# Patient Record
Sex: Male | Born: 1950 | Race: Asian | Hispanic: No | Marital: Married | Smoking: Former smoker
Health system: Southern US, Community
[De-identification: ages and names within clinical notes are randomized; demographics above are authoritative.]

## PROBLEM LIST (undated history)

## (undated) HISTORY — PX: EYE SURGERY: SHX253

---

## 2019-08-19 ENCOUNTER — Other Ambulatory Visit: Payer: Self-pay | Admitting: Otolaryngology

## 2019-08-19 DIAGNOSIS — H9191 Unspecified hearing loss, right ear: Secondary | ICD-10-CM

## 2019-08-19 DIAGNOSIS — H9311 Tinnitus, right ear: Secondary | ICD-10-CM

## 2019-09-09 ENCOUNTER — Ambulatory Visit
Admission: RE | Admit: 2019-09-09 | Discharge: 2019-09-09 | Disposition: A | Discharge status: Home / Self Care | Payer: Medicare HMO | Source: Ambulatory Visit | Attending: Otolaryngology | Admitting: Otolaryngology

## 2019-09-09 DIAGNOSIS — H9191 Unspecified hearing loss, right ear: Secondary | ICD-10-CM

## 2019-09-09 DIAGNOSIS — H9311 Tinnitus, right ear: Secondary | ICD-10-CM

## 2019-09-09 MED ORDER — GADOBENATE DIMEGLUMINE 529 MG/ML IV SOLN
13.0000 mL | Freq: Once | INTRAVENOUS | Status: AC | PRN
  Administered 2019-09-09: 14:00:00 13 mL via INTRAVENOUS

## 2020-01-29 ENCOUNTER — Ambulatory Visit: Payer: Medicare HMO | Attending: Internal Medicine

## 2020-01-29 DIAGNOSIS — Z23 Encounter for immunization: Secondary | ICD-10-CM

## 2020-01-29 NOTE — Progress Notes (Signed)
   Covid-19 Vaccination Clinic  Name:  Jamieon Lannen    MRN: [id number] DOB: 04/13/1951  01/29/2020  Mr. Pollack was observed post Covid-19 immunization for 15 minutes without incidence. He was provided with Vaccine Information Sheet and instruction to access the V-Safe system.   Mr. Bensinger was instructed to call 911 with any severe reactions post vaccine: Marland Kitchen Difficulty breathing  . Swelling of your face and throat  . A fast heartbeat  . A bad rash all over your body  . Dizziness and weakness    Immunizations Administered    Name Date Dose VIS Date Route   Pfizer COVID-19 Vaccine 01/29/2020  2:38 PM 0.3 mL 11/13/2019 Intramuscular   Manufacturer: [employer], [employer]   Lot: [id number]   NDC: [address]-3

## 2020-02-24 ENCOUNTER — Ambulatory Visit: Payer: Medicare HMO | Attending: Internal Medicine

## 2020-02-24 DIAGNOSIS — Z23 Encounter for immunization: Secondary | ICD-10-CM

## 2020-02-24 NOTE — Progress Notes (Signed)
   Covid-19 Vaccination Clinic  Name:  Hunter Atkinson    MRN: [id number] DOB: 1951-08-29  02/24/2020  Hunter Atkinson was observed post Covid-19 immunization for 15 minutes without incident. He was provided with Vaccine Information Sheet and instruction to access the V-Safe system.   Hunter Atkinson was instructed to call 911 with any severe reactions post vaccine: Marland Kitchen Difficulty breathing  . Swelling of face and throat  . A fast heartbeat  . A bad rash all over body  . Dizziness and weakness   Immunizations Administered    Name Date Dose VIS Date Route   Pfizer COVID-19 Vaccine 02/24/2020  2:57 PM 0.3 mL 11/13/2019 Intramuscular   Manufacturer: [employer], [employer]   Lot: [id number]   NDC: [address]-6

## 2020-10-12 IMAGING — MR MR BRAIN/IAC WO/W CM
11 of 12 series · 38 of 48 positions shown · IV contrast (multihance)
Comparison: None.

CLINICAL DATA: Right-sided hearing loss and tinnitus

EXAM:
MRI HEAD WITHOUT AND WITH CONTRAST
TECHNIQUE: Multiplanar, multiecho pulse sequences of the brain and surrounding
structures were obtained without and with intravenous contrast.
CONTRAST:  13mL MULTIHANCE GADOBENATE DIMEGLUMINE 529 MG/ML IV SOLN

[Series 2: T1 · sagittal · 5.0mm · 0.45mm/px · 4 of 23 slices shown (1 of 3)]
[im 1/23]
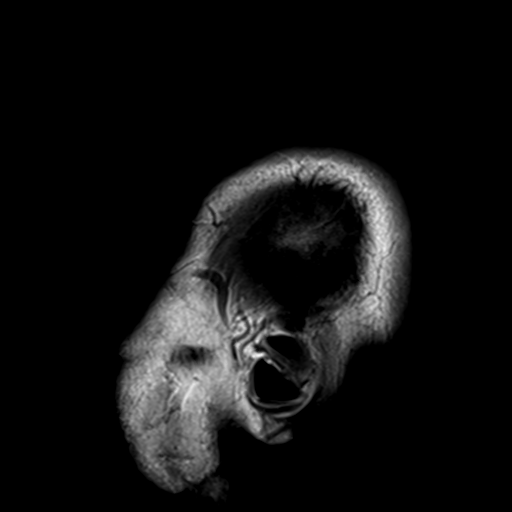
[im 8/23]
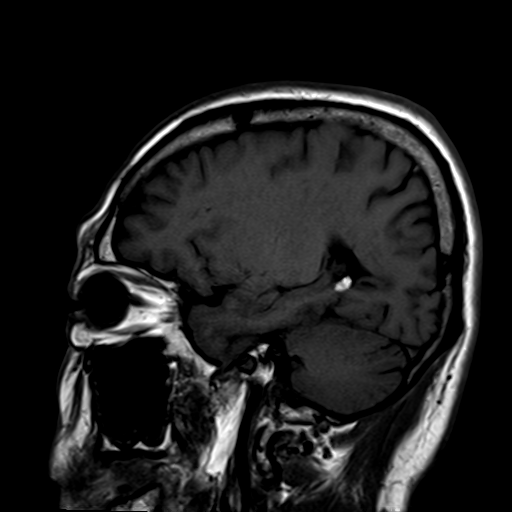
[im 15/23]
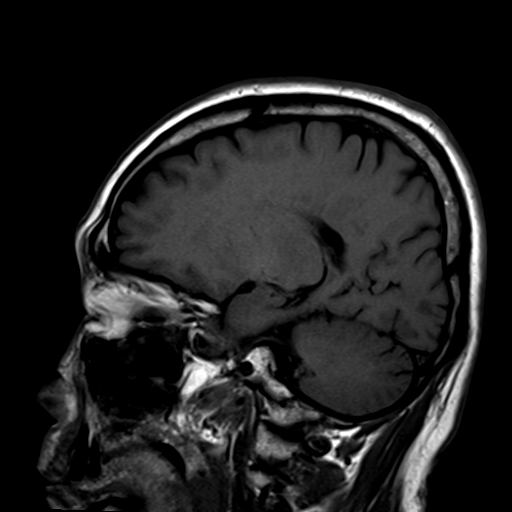
[im 23/23]
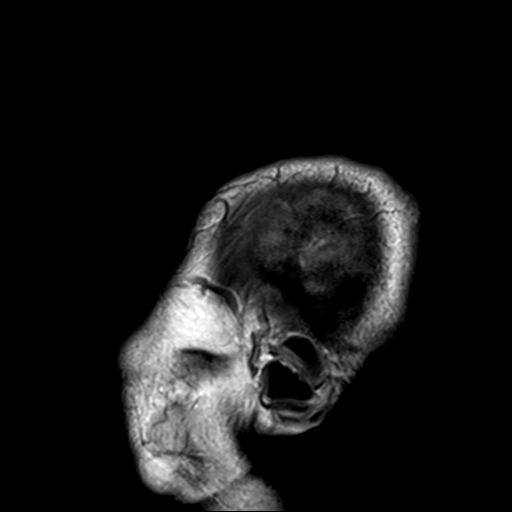

[Series 3: T2 · axial · 5.0mm · 0.45mm/px · z∈[-62,+80]mm · 4 of 23 slices shown]
[im 1/23]
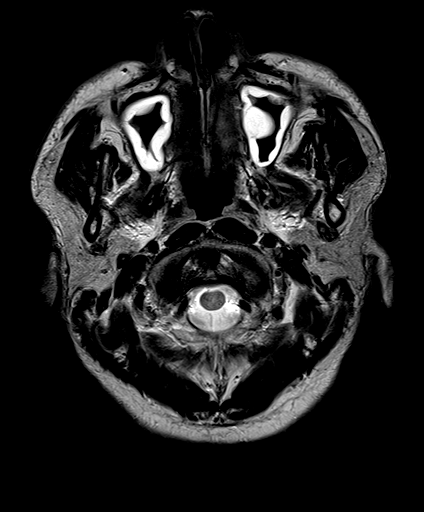
[im 8/23]
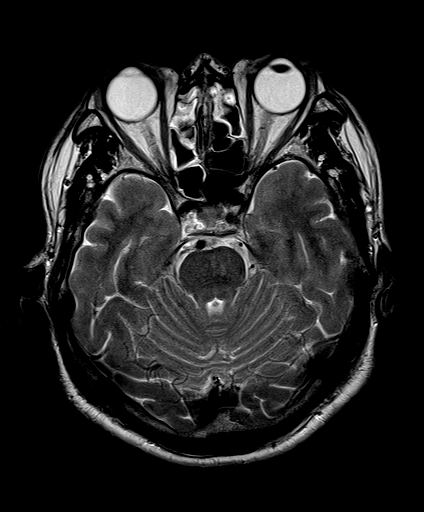
[im 15/23]
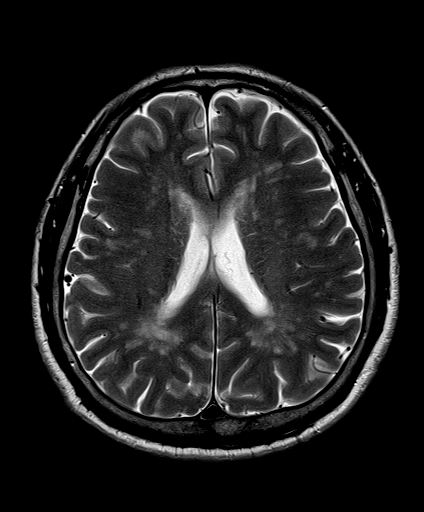
[im 23/23]
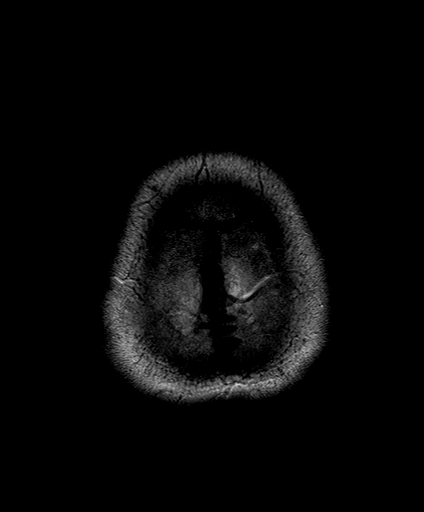

[Series 4: DWI · axial · 3.0mm · 1.80mm/px · z∈[-61,+79]mm · 11 of 96 slices shown (1 of 2)]
[im 1/96]
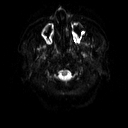
[im 10/96]
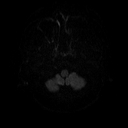
[im 20/96]
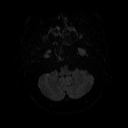
[im 29/96]
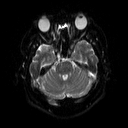
[im 39/96]
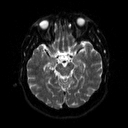
[im 48/96]
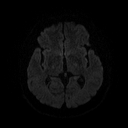
[im 58/96]
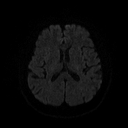
[im 67/96]
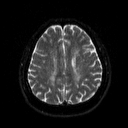
[im 77/96]
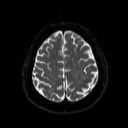
[im 86/96]
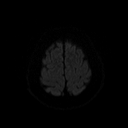
[im 96/96]
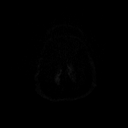

[Series 5: DWI · axial · 3.0mm · 1.80mm/px · z∈[-61,+79]mm · 5 of 47 slices shown (2 of 2)]
[im 1/47]
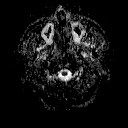
[im 12/47]
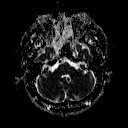
[im 24/47]
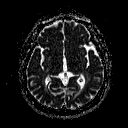
[im 35/47]
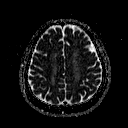
[im 47/47]
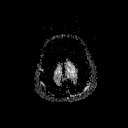

[Series 6: FLAIR · axial · 3.0mm · 0.45mm/px · z∈[-62,+80]mm · 3 of 30 slices shown]
[im 1/30]
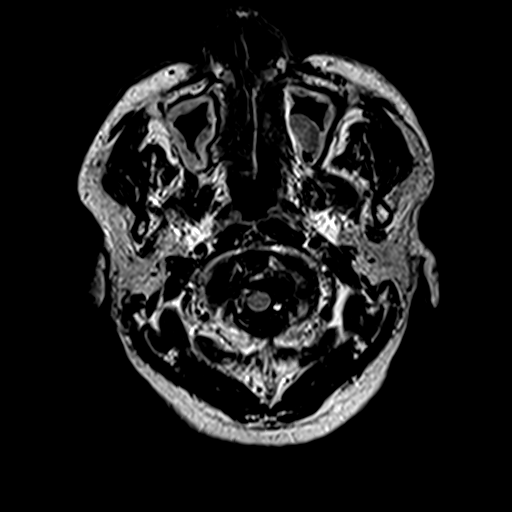
[im 15/30]
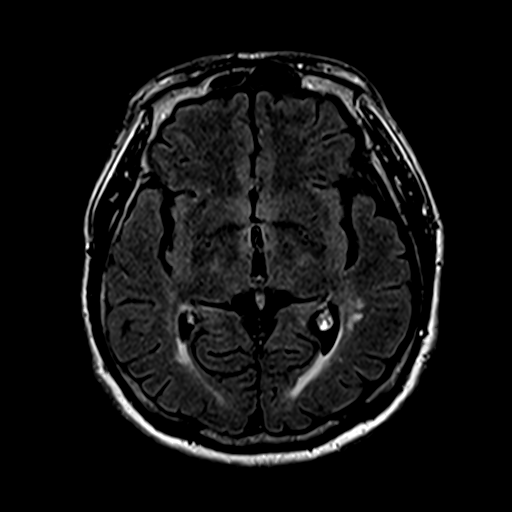
[im 30/30]
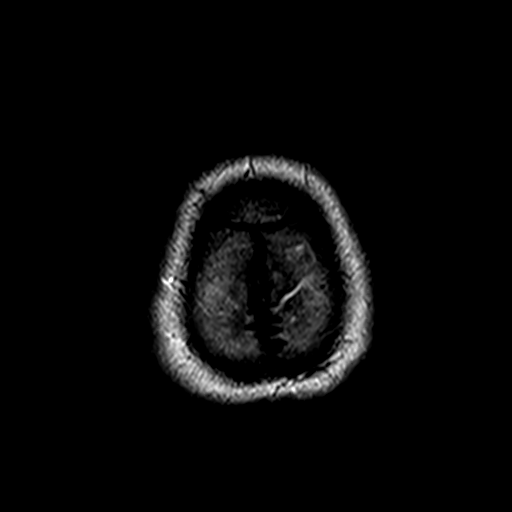

[Series 7: T1 · coronal · 3.0mm · 0.35mm/px · 1 of 11 slices shown (2 of 3)]
[im 1/11]
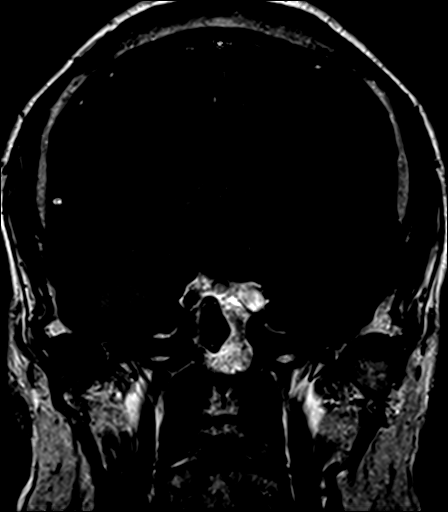

[Series 9: swi_images · axial · 3.0mm · 0.90mm/px · z∈[-61,+79]mm · 5 of 48 slices shown]
[im 1/48]
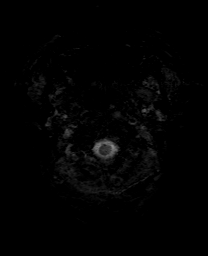
[im 12/48]
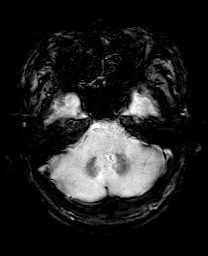
[im 24/48]
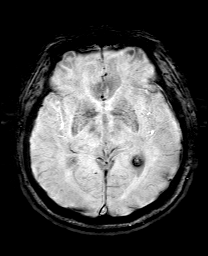
[im 36/48]
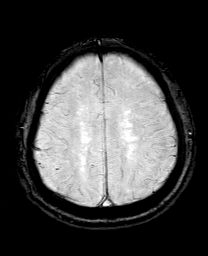
[im 48/48]
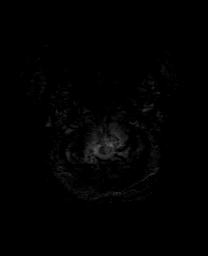

[Series 10: T1 · axial · 3.0mm · 0.35mm/px · 1 of 11 slices shown (3 of 3)]
[im 1/11]
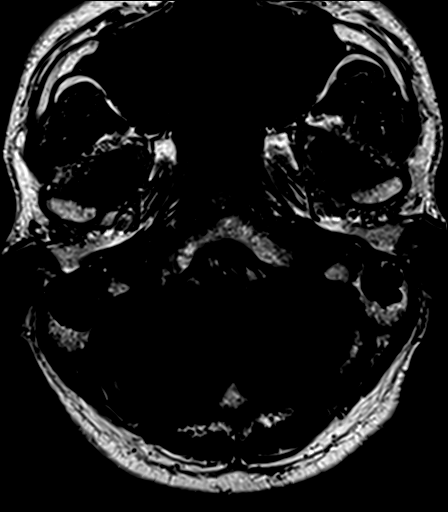

[Series 11: bSSFP · axial · 1.0mm · 0.28mm/px · z∈[-50,-39]mm · 2 of 36 slices shown]
[im 1/36]
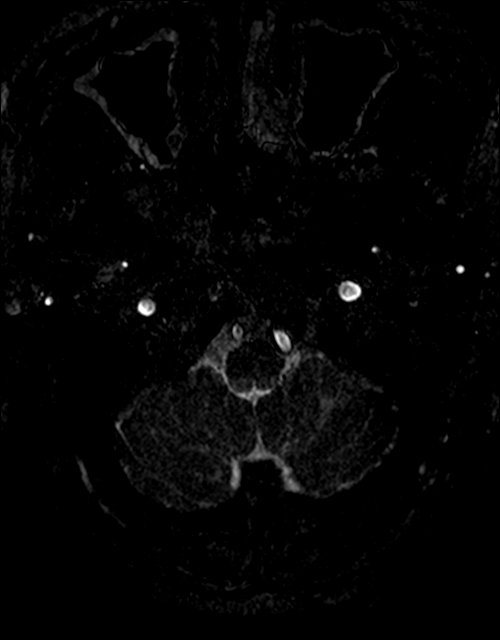
[im 12/36]
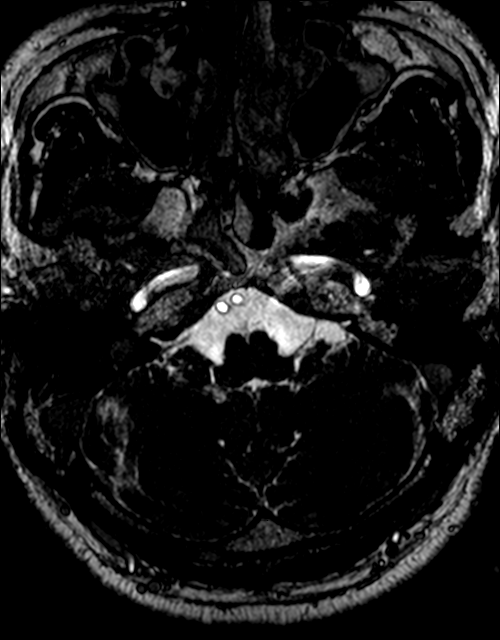

[Series 12: T1 post-contrast · coronal · 3.0mm · 0.35mm/px · 1 of 11 slices shown (1 of 2)]
[im 1/11]
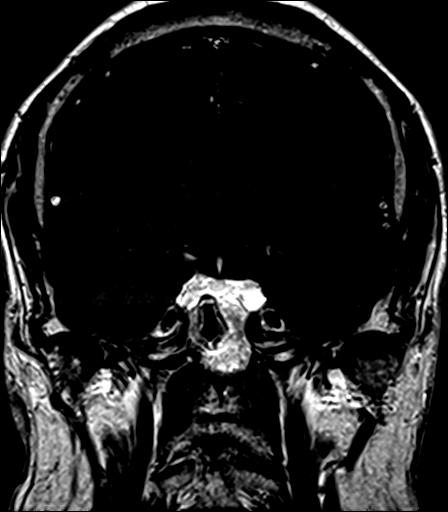

[Series 13: T1 post-contrast · axial · 3.0mm · 0.35mm/px · 1 of 11 slices shown (2 of 2)]
[im 1/11]
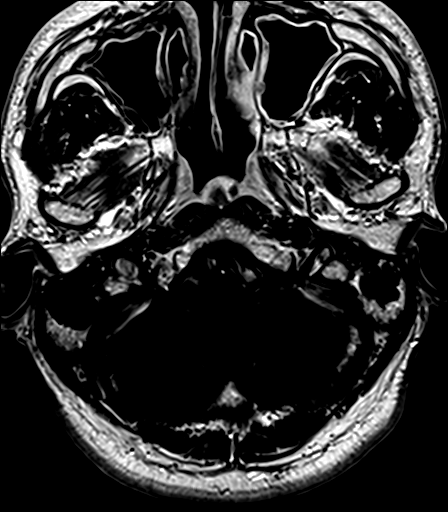

[38 of 48 positions shown; findings below may reference images not displayed]

FINDINGS: BRAIN: There is no acute infarct, acute hemorrhage or extra-axial
collection. Early confluent hyperintense T2-weighted signal of the
periventricular and deep white matter, most commonly due to chronic
ischemic microangiopathy. The cerebral and cerebellar volume are
age-appropriate. There is no hydrocephalus. The midline structures
are normal.

INTERNAL AUDITORY CANALS: There is no cerebellopontine angle mass.
The cochleae and semicircular canals are normal. No focal
abnormality along the course of the 7th and 8th cranial nerves.
Normal porus acusticus and vestibular aqueduct bilaterally.

VASCULAR: The major intracranial arterial and venous sinus flow
voids are normal. Focus of chronic microhemorrhage in the left
temporal lobe.

SKULL AND UPPER CERVICAL SPINE: Calvarial bone marrow signal is
normal. There is no skull base mass. The visualized upper cervical
spine and soft tissues are normal.

SINUSES/ORBITS: There are no fluid levels or advanced mucosal
thickening. The mastoid air cells and middle ear cavities are free
of fluid. The orbits are normal.
IMPRESSION: 1. Normal MRI of the internal auditory canals and inner ear
structures.
2. No acute intracranial abnormality.
3. Chronic ischemic microangiopathy.

## 2022-05-25 ENCOUNTER — Ambulatory Visit (INDEPENDENT_AMBULATORY_CARE_PROVIDER_SITE_OTHER): Payer: Medicare HMO | Admitting: Orthopaedic Surgery

## 2022-05-25 ENCOUNTER — Other Ambulatory Visit: Payer: Self-pay

## 2022-05-25 ENCOUNTER — Ambulatory Visit: Payer: Self-pay

## 2022-05-25 VITALS — BP 126/74 | HR 84

## 2022-05-25 DIAGNOSIS — G8929 Other chronic pain: Secondary | ICD-10-CM

## 2022-05-25 DIAGNOSIS — M5442 Lumbago with sciatica, left side: Secondary | ICD-10-CM | POA: Diagnosis not present

## 2022-05-25 DIAGNOSIS — M5441 Lumbago with sciatica, right side: Secondary | ICD-10-CM

## 2022-05-25 MED ORDER — PREDNISONE 5 MG (21) PO TBPK: ORAL_TABLET | ORAL | 0 refills | Status: DC

## 2022-06-14 ENCOUNTER — Ambulatory Visit: Payer: Medicare HMO

## 2022-06-18 ENCOUNTER — Ambulatory Visit: Payer: Medicare HMO | Attending: Orthopaedic Surgery

## 2022-06-18 DIAGNOSIS — G8929 Other chronic pain: Secondary | ICD-10-CM | POA: Diagnosis not present

## 2022-06-18 DIAGNOSIS — M5442 Lumbago with sciatica, left side: Secondary | ICD-10-CM | POA: Diagnosis not present

## 2022-06-18 DIAGNOSIS — M5432 Sciatica, left side: Secondary | ICD-10-CM | POA: Diagnosis present

## 2022-06-18 DIAGNOSIS — M5431 Sciatica, right side: Secondary | ICD-10-CM | POA: Diagnosis present

## 2022-06-18 DIAGNOSIS — M5459 Other low back pain: Secondary | ICD-10-CM | POA: Insufficient documentation

## 2022-06-18 DIAGNOSIS — M5441 Lumbago with sciatica, right side: Secondary | ICD-10-CM | POA: Insufficient documentation

## 2022-06-18 DIAGNOSIS — M6281 Muscle weakness (generalized): Secondary | ICD-10-CM | POA: Diagnosis present

## 2022-06-18 NOTE — Therapy (Addendum)
OUTPATIENT PHYSICAL THERAPY THORACOLUMBAR EVALUATION   Patient Name: Hunter Atkinson MRN: [id number] DOB:12/15/1950, 71 y.o., male Today's Date: 06/18/2022 PHYSICAL THERAPY DISCHARGE SUMMARY  Visits from Start of Care: 1  Current functional level related to goals / functional outcomes: N/a   Remaining deficits: N/a   Education / Equipment: HEP   Patient agrees to discharge. Patient goals were met. Patient is being discharged due to being pleased with the current functional level.   PT End of Session - 06/18/22 1531     Visit Number 1    Number of Visits 1    Authorization Type [employer]    PT Start Time 1400    PT Stop Time 1445    PT Time Calculation (min) 45 min    Activity Tolerance Patient tolerated treatment well    Behavior During Therapy WFL for tasks assessed/performed             History reviewed. No pertinent past medical history. History reviewed. No pertinent surgical history. There are no problems to display for this patient.   PCP: Patient, No Pcp Per  REFERRING PROVIDER: Yates, Mark C, MD  REFERRING DIAG: M54.42,M54.41,G89.29 (ICD-10-CM) - Chronic bilateral low back pain with bilateral sciatica  Rationale for Evaluation and Treatment Rehabilitation  THERAPY DIAG: low back pain   ONSET DATE: 05/25/2022  SUBJECTIVE:                                                                                                                                                                                           SUBJECTIVE STATEMENT: Patient reports  PERTINENT HISTORY:   Lower Back - Pain      HPI 71-year-old male Korean we used a video translator he is here with significant low back pain he states he can only stand for 2 minutes and then has pain.  Patient is a smoker he has a history of being a hospital in [place] he had traction and a back brace medication therapy and after several days got better and was discharged.  He has done well in the interim until  recently with increased pain.  He states he is able to stand and can walk a considerable distance and does not have pain with that activity but has significant increased pain if he just stops and stands.   Review of Systems positive smoking past history of low back problems otherwise noncontributory to HPI.  PAIN:  Are you having pain? Yes: NPRS scale: 4/10 Pain location: low back Pain description: ache Aggravating factors: lifting and golf Relieving factors: back brace   PRECAUTIONS: Back  WEIGHT BEARING RESTRICTIONS No  FALLS:    Has patient fallen in last 6 months? No  LIVING ENVIRONMENT: Lives with: lives with their family Lives in: House/apartment Stairs:  yes Has following equipment at home: None  OCCUPATION: retired  PLOF: Independent  PATIENT GOALS To reduce and manage my back pain   OBJECTIVE:   DIAGNOSTIC FINDINGS:  Ortho Exam negative logroll to the hips negative straight leg raising 90 degrees some sciatic notch tenderness patient is able to walk heel and toe walk gross exam room anterior tib gastrocsoleus is strong quads are strong knees reach full extension.  Pedal pulses are 2+ dorsalis pedis posterior tib right and left.   Specialty Comments:  No specialty comments available.   Imaging: P lateral lumbar spine images are obtained and reviewed.  Slight abdominal calcification.  2 mm retrolisthesis at L5-S1.  Mild facet arthropathy in the lumbar spine.  Impression: Lumbar spine demonstrates mild degenerative changes  PATIENT SURVEYS:  UTA due to language barrier  SCREENING FOR RED FLAGS: Bowel or bladder incontinence: No   COGNITION:  Overall cognitive status: Within functional limits for tasks assessed     SENSATION: Not tested  MUSCLE LENGTH: Hamstrings: Right 80 deg; Left 80 deg  POSTURE: decreased lumbar lordosis  PALPATION: Increased tone in R paraspinals  LUMBAR ROM:   Active  A/PROM  eval  Flexion 90%  Extension 25%  Right  lateral flexion 75%  Left lateral flexion 75%  Right rotation   Left rotation    (Blank rows = not tested)  LOWER EXTREMITY ROM:   WFL throughout  Active  Right eval Left eval  Hip flexion    Hip extension    Hip abduction    Hip adduction    Hip internal rotation    Hip external rotation    Knee flexion    Knee extension    Ankle dorsiflexion    Ankle plantarflexion    Ankle inversion    Ankle eversion     (Blank rows = not tested)  LOWER EXTREMITY MMT:  [facility]   MMT Right eval Left eval  Hip flexion    Hip extension    Hip abduction    Hip adduction    Hip internal rotation    Hip external rotation    Knee flexion    Knee extension    Ankle dorsiflexion    Ankle plantarflexion    Ankle inversion    Ankle eversion     (Blank rows = not tested)  LUMBAR SPECIAL TESTS:  Straight leg raise test: Negative, Slump test: Negative, and FABER test: Negative  FUNCTIONAL TESTS:  5 times sit to stand: 13s  GAIT: Distance walked: 56f x2 Assistive device utilized: None Level of assistance: Complete Independence     TODAY'S TREATMENT  Eval and HEP   PATIENT EDUCATION:  Education details: Discussed eval findings, rehab rationale and POC and patient is in agreement  Person educated: Patient Education method: Explanation Education comprehension: verbalized understanding and needs further education   HOME EXERCISE PROGRAM: Access Code: [email or web address] URL: [email or web address] Date: 06/18/2022 Prepared by: JSharlynn Oliphant Exercises - Prone Press Up On Elbows  - 2 x daily - 7 x weekly - 1 sets - 1 reps - 5 min hold - Supine Piriformis Stretch with Foot on Ground  - 2 x daily - 7 x weekly - 1 sets - 3 reps - 30s hold  ASSESSMENT:  CLINICAL IMPRESSION: Patient is a 71y.o. male who was seen today for physical therapy evaluation and treatment for low  back pain. Patient arrives with minimal symptoms and requests to DC to HEP with stretching exercises to  resolve lumbar extension deficits and R piriformis restrictions.   OBJECTIVE IMPAIRMENTS decreased ROM, increased muscle spasms, impaired flexibility, postural dysfunction, and pain.   ACTIVITY LIMITATIONS lifting and bending  PARTICIPATION LIMITATIONS:  golf  PERSONAL FACTORS Age, Past/current experiences, and Time since onset of injury/illness/exacerbation are also affecting patient's functional outcome.   REHAB POTENTIAL: Good  CLINICAL DECISION MAKING: Stable/uncomplicated  EVALUATION COMPLEXITY: Low   GOALS: Goals reviewed with patient? No  SHORT TERM GOALS: STGs=LTGs    LONG TERM GOALS: Target date: 07/16/2022  Patient to demonstrate independence in HEP  Baseline: [email or web address] Goal status: INITIAL     PLAN: PT FREQUENCY: 1x/week  PT DURATION: 4 weeks  PLANNED INTERVENTIONS: Therapeutic exercises, Therapeutic activity, Neuromuscular re-education, Balance training, Gait training, Patient/Family education, Self Care, Joint mobilization, Manual therapy, and Re-evaluation.  PLAN FOR NEXT SESSION: DC to HEP   Lanice Shirts, PT 06/18/2022, 3:38 PM

## 2022-06-26 ENCOUNTER — Ambulatory Visit (INDEPENDENT_AMBULATORY_CARE_PROVIDER_SITE_OTHER): Payer: Medicare HMO

## 2022-06-26 ENCOUNTER — Encounter: Payer: Self-pay | Admitting: Orthopaedic Surgery

## 2022-06-26 ENCOUNTER — Ambulatory Visit: Payer: Medicare HMO | Admitting: Orthopaedic Surgery

## 2022-06-26 VITALS — BP 120/68 | HR 81

## 2022-06-26 DIAGNOSIS — M542 Cervicalgia: Secondary | ICD-10-CM | POA: Diagnosis not present

## 2022-06-26 DIAGNOSIS — M4722 Other spondylosis with radiculopathy, cervical region: Secondary | ICD-10-CM | POA: Diagnosis not present

## 2022-06-26 MED ORDER — PREDNISONE 5 MG (21) PO TBPK: ORAL_TABLET | ORAL | 0 refills | Status: DC

## 2022-06-26 NOTE — Progress Notes (Unsigned)
Office Visit Note   Patient: Hunter Atkinson           Date of Birth: 15-Aug-1951           MRN: [id number] Visit Date: 06/26/2022              Requested by: No referring provider defined for this encounter. PCP: Patient, No Pcp Per   Assessment & Plan: Visit Diagnoses:  1. Neck pain   2. Other spondylosis with radiculopathy, cervical region     Plan: We will recheck him in 2 months repeat 5 mg prednisone Dosepak.  If he continues to have ongoing symptoms we discussed surgical treatment options for his neck problem.  He has been through physical therapy anti-inflammatories prednisone Dosepak.  He has used a back brace for his lumbar pain in the past.  Follow-Up Instructions: Return in about 2 months (around 08/27/2022).   Orders:  Orders Placed This Encounter  Procedures   XR Cervical Spine 2 or 3 views   Meds ordered this encounter  Medications   predniSONE (STERAPRED UNI-PAK 21 TAB) 5 MG (21) TBPK tablet    Sig: Take 6,5,4,3,2,1 one tablet less each day.    Dispense:  21 tablet    Refill:  0      Procedures: No procedures performed   Clinical Data: No additional findings.   Subjective: Chief Complaint  Patient presents with   Lower Back - Follow-up, Pain   Neck - Pain    HPI 71 year old male with cervical spondylosis also with some persistent problems with back pain with disc bulge at L5-S1.  He was given a Dosepak states it helped he is getting better and states his neck pains been hurting for more than 1 year with right arm numbness and tingling no response with massage.  Prednisone Dosepak helped but then wore off.  Patient is here with a [place] interpreter.  Past history of smoking.  Review of Systems 14 point update unchanged from 05/25/2022 office visit.   Objective: Vital Signs: BP 120/68   Pulse 81   Physical Exam Constitutional:      Appearance: He is well-developed.  HENT:     Head: Normocephalic and atraumatic.     Right Ear: External ear  normal.     Left Ear: External ear normal.  Eyes:     Pupils: Pupils are equal, round, and reactive to light.  Neck:     Thyroid: No thyromegaly.     Trachea: No tracheal deviation.  Cardiovascular:     Rate and Rhythm: Normal rate.  Pulmonary:     Effort: Pulmonary effort is normal.     Breath sounds: No wheezing.  Abdominal:     General: Bowel sounds are normal.     Palpations: Abdomen is soft.  Musculoskeletal:     Cervical back: Neck supple.  Skin:    General: Skin is warm and dry.     Capillary Refill: Capillary refill takes less than 2 seconds.  Neurological:     Mental Status: He is alert and oriented to person, place, and time.  Psychiatric:        Behavior: Behavior normal.        Thought Content: Thought content normal.        Judgment: Judgment normal.     Ortho Exam patient has continued right brachial plexus tenderness none on the left positive Spurling.  No trochanteric bursal tenderness negative logroll to his hips.  Specialty Comments:  No specialty  comments available.  Imaging: No results found.   PMFS History: Patient Active Problem List   Diagnosis Date Noted   Other spondylosis with radiculopathy, cervical region 06/27/2022   History reviewed. No pertinent past medical history.  History reviewed. No pertinent family history.  History reviewed. No pertinent surgical history. Social History   Occupational History   Not on file  Tobacco Use   Smoking status: Not on file   Smokeless tobacco: Not on file  Substance and Sexual Activity   Alcohol use: Not on file   Drug use: Not on file   Sexual activity: Not on file

## 2022-06-27 DIAGNOSIS — M4722 Other spondylosis with radiculopathy, cervical region: Secondary | ICD-10-CM | POA: Insufficient documentation

## 2023-12-17 ENCOUNTER — Other Ambulatory Visit (INDEPENDENT_AMBULATORY_CARE_PROVIDER_SITE_OTHER): Payer: Medicare HMO

## 2023-12-17 ENCOUNTER — Ambulatory Visit (INDEPENDENT_AMBULATORY_CARE_PROVIDER_SITE_OTHER): Payer: Medicare HMO | Admitting: Orthopaedic Surgery

## 2023-12-17 VITALS — BP 165/70 | HR 76

## 2023-12-17 DIAGNOSIS — M542 Cervicalgia: Secondary | ICD-10-CM

## 2023-12-17 MED ORDER — PREDNISONE 5 MG (21) PO TBPK: ORAL_TABLET | ORAL | 0 refills | Status: DC

## 2023-12-17 NOTE — Progress Notes (Signed)
 Office Visit Note   Patient: Hunter Atkinson           Date of Birth: August 30, 1951           MRN: [id number] Visit Date: 12/17/2023              Requested by: No referring provider defined for this encounter. PCP: Patient, No Pcp Per   Assessment & Plan: Visit Diagnoses:  1. Neck pain     Plan: Patient gets relief with distraction we will set him up with a home cervical traction said he can use at home prednisone  5 mg Dosepak sent in.  Recheck 4 weeks to be 7 persistent symptoms we will proceed with imaging studies since has been her persistent problem now present for more than 2 years and with significant increase in his symptoms.  Follow-Up Instructions: Return in about 4 weeks (around 01/14/2024).   Orders:  Orders Placed This Encounter  Procedures   XR Cervical Spine 2 or 3 views   Meds ordered this encounter  Medications   predniSONE  (STERAPRED UNI-PAK 21 TAB) 5 MG (21) TBPK tablet    Sig: Take 6,5,4,3,2,1 one tablet less each day.    Dispense:  21 tablet    Refill:  0      Procedures: No procedures performed   Clinical Data: No additional findings.   Subjective: Chief Complaint  Patient presents with   Neck - Pain   Right Shoulder - Pain    HPI 73 year old male returns with ongoing problems with neck pain radiates into his right arm down to his right middle fingers.  Patient has been seen for lumbar disc problems as well.  He states pain is now severe in his neck none on the left side all on the right.  No myelopathic gait problems.  He has had tingling into his fingers and use Tylenol without relief.  He has previously been through conservative treatment including anti-inflammatories muscle relaxants prednisone  Dosepak which helped.  He did have A1c 5.6 and nonfasting glucose of 113.  Previous Dosepak was 5 mg reduced dose.  He did report relief previously.  Past history of smoking.  Patient seen with [place] interpreter.  Patient states at times he does better  sleeping his arm up over his head which gives him some relief.  Review of Systems 14 point system update unchanged from 05/25/2022 other than as mentioned in HPI.   Objective: Vital Signs: BP (!) 165/70   Pulse 76   Physical Exam Constitutional:      Appearance: He is well-developed.  HENT:     Head: Normocephalic and atraumatic.     Right Ear: External ear normal.     Left Ear: External ear normal.  Eyes:     Pupils: Pupils are equal, round, and reactive to light.  Neck:     Thyroid: No thyromegaly.     Trachea: No tracheal deviation.  Cardiovascular:     Rate and Rhythm: Normal rate.  Pulmonary:     Effort: Pulmonary effort is normal.     Breath sounds: No wheezing.  Abdominal:     General: Bowel sounds are normal.     Palpations: Abdomen is soft.  Musculoskeletal:     Cervical back: Neck supple.  Skin:    General: Skin is warm and dry.     Capillary Refill: Capillary refill takes less than 2 seconds.  Neurological:     Mental Status: He is alert and oriented to person, place,  and time.  Psychiatric:        Behavior: Behavior normal.        Thought Content: Thought content normal.        Judgment: Judgment normal.     Ortho Exam upper extremity reflexes 2+ and symmetrical.  Positive Spurling on the right negative on the left increased pain with compression relief with distraction.  Severe brachioplexus tenderness on the right.  Wrist flexion extension biceps triceps is strong.  Specialty Comments:  No specialty comments available.  Imaging: XR Cervical Spine 2 or 3 views Result Date: 12/17/2023 AP lateral cervical spine images are obtained and reviewed.  Comparison to 06/27/2022 images.  Again noted cervical spondylosis with anterior spurring and some narrowing C5-6 without listhesis. Impression: Mid cervical spondylosis C5-6 without significant progression.    PMFS History: Patient Active Problem List   Diagnosis Date Noted   Other spondylosis with  radiculopathy, cervical region 06/27/2022   No past medical history on file.  No family history on file.  No past surgical history on file. Social History   Occupational History   Not on file  Tobacco Use   Smoking status: Not on file   Smokeless tobacco: Not on file  Substance and Sexual Activity   Alcohol use: Not on file   Drug use: Not on file   Sexual activity: Not on file

## 2024-01-21 ENCOUNTER — Ambulatory Visit: Payer: Medicare HMO | Admitting: Orthopaedic Surgery

## 2024-01-21 VITALS — BP 143/75

## 2024-01-21 DIAGNOSIS — M4722 Other spondylosis with radiculopathy, cervical region: Secondary | ICD-10-CM | POA: Diagnosis not present

## 2024-01-22 NOTE — Progress Notes (Signed)
Office Visit Note   Patient: Hunter Atkinson           Date of Birth: 1951-01-25           MRN: [id number] Visit Date: 01/21/2024              Requested by: No referring provider defined for this encounter. PCP: Patient, No Pcp Per   Assessment & Plan: Visit Diagnoses: No diagnosis found.  Plan: Patient with cervical spondylosis C5-6.  We discussed MRI scan he wants to continue use of traction for a while and he will call if he like to proceed with MRI imaging.  He understands he may need some surgery by Dr. Christell Constant if he has progressive symptoms.  He will call if he has increased symptoms or call if he like to proceed with MRI scan cervical spine evaluating for cervical spondylosis and paracentral compression on the right side.  Follow-Up Instructions: Return if symptoms worsen or fail to improve.   Orders:  No orders of the defined types were placed in this encounter.  No orders of the defined types were placed in this encounter.     Procedures: No procedures performed   Clinical Data: No additional findings.   Subjective: Chief Complaint  Patient presents with   Neck - Pain, Follow-up    HPI 73 year old male returns for follow-up cervical spondylosis.  He has been getting some relief with traction uses it regularly.  Prednisone helped until he finished it and then he had some recurrence of symptoms.  Pain radiates from his neck into his right arm.  No gait disturbance.  Review of Systems updated unchanged from 12/17/2023 office visit.   Objective: Vital Signs: BP (!) 143/75   Physical Exam Constitutional:      Appearance: He is well-developed.  HENT:     Head: Normocephalic and atraumatic.     Right Ear: External ear normal.     Left Ear: External ear normal.  Eyes:     Pupils: Pupils are equal, round, and reactive to light.  Neck:     Thyroid: No thyromegaly.     Trachea: No tracheal deviation.  Cardiovascular:     Rate and Rhythm: Normal rate.  Pulmonary:      Effort: Pulmonary effort is normal.     Breath sounds: No wheezing.  Abdominal:     General: Bowel sounds are normal.     Palpations: Abdomen is soft.  Musculoskeletal:     Cervical back: Neck supple.  Skin:    General: Skin is warm and dry.     Capillary Refill: Capillary refill takes less than 2 seconds.  Neurological:     Mental Status: He is alert and oriented to person, place, and time.  Psychiatric:        Behavior: Behavior normal.        Thought Content: Thought content normal.        Judgment: Judgment normal.     Ortho Exam positive brachioplexus tenderness on the right negative on the left.  Normal heel-toe gait.  No clonus. No atrophy of the upper extremity. Specialty Comments:  No specialty comments available.  Imaging: No results found.   PMFS History: Patient Active Problem List   Diagnosis Date Noted   Other spondylosis with radiculopathy, cervical region 06/27/2022   No past medical history on file.  No family history on file.  No past surgical history on file. Social History   Occupational History   Not on  file  Tobacco Use   Smoking status: Not on file   Smokeless tobacco: Not on file  Substance and Sexual Activity   Alcohol use: Not on file   Drug use: Not on file   Sexual activity: Not on file

## 2024-10-05 ENCOUNTER — Encounter: Payer: Self-pay | Admitting: Radiology

## 2024-11-14 ENCOUNTER — Other Ambulatory Visit: Payer: Self-pay

## 2024-11-14 ENCOUNTER — Encounter (HOSPITAL_COMMUNITY): Payer: Self-pay

## 2024-11-14 ENCOUNTER — Emergency Department (HOSPITAL_COMMUNITY)

## 2024-11-14 ENCOUNTER — Ambulatory Visit (HOSPITAL_COMMUNITY)
Admission: EM | Admit: 2024-11-14 | Discharge: 2024-11-14 | Disposition: A | Discharge status: Home / Self Care | Attending: Emergency Medicine | Admitting: Emergency Medicine

## 2024-11-14 ENCOUNTER — Ambulatory Visit (HOSPITAL_COMMUNITY)

## 2024-11-14 ENCOUNTER — Observation Stay (HOSPITAL_COMMUNITY)
Admission: EM | Admit: 2024-11-14 | Discharge: 2024-11-15 | Disposition: A | Discharge status: Home / Self Care | Attending: Internal Medicine | Admitting: Internal Medicine

## 2024-11-14 ENCOUNTER — Encounter (HOSPITAL_COMMUNITY): Payer: Self-pay | Admitting: Emergency Medicine

## 2024-11-14 DIAGNOSIS — I10 Essential (primary) hypertension: Secondary | ICD-10-CM | POA: Insufficient documentation

## 2024-11-14 DIAGNOSIS — E871 Hypo-osmolality and hyponatremia: Secondary | ICD-10-CM | POA: Diagnosis not present

## 2024-11-14 DIAGNOSIS — J9601 Acute respiratory failure with hypoxia: Secondary | ICD-10-CM | POA: Diagnosis not present

## 2024-11-14 DIAGNOSIS — R058 Other specified cough: Secondary | ICD-10-CM | POA: Diagnosis present

## 2024-11-14 DIAGNOSIS — Z79899 Other long term (current) drug therapy: Secondary | ICD-10-CM | POA: Insufficient documentation

## 2024-11-14 DIAGNOSIS — R0602 Shortness of breath: Secondary | ICD-10-CM | POA: Diagnosis present

## 2024-11-14 DIAGNOSIS — J441 Chronic obstructive pulmonary disease with (acute) exacerbation: Secondary | ICD-10-CM | POA: Insufficient documentation

## 2024-11-14 DIAGNOSIS — E785 Hyperlipidemia, unspecified: Secondary | ICD-10-CM | POA: Insufficient documentation

## 2024-11-14 DIAGNOSIS — N400 Enlarged prostate without lower urinary tract symptoms: Secondary | ICD-10-CM | POA: Insufficient documentation

## 2024-11-14 DIAGNOSIS — F1090 Alcohol use, unspecified, uncomplicated: Secondary | ICD-10-CM | POA: Insufficient documentation

## 2024-11-14 DIAGNOSIS — R051 Acute cough: Secondary | ICD-10-CM

## 2024-11-14 DIAGNOSIS — Z87891 Personal history of nicotine dependence: Secondary | ICD-10-CM | POA: Diagnosis not present

## 2024-11-14 DIAGNOSIS — R0902 Hypoxemia: Secondary | ICD-10-CM | POA: Diagnosis not present

## 2024-11-14 DIAGNOSIS — E119 Type 2 diabetes mellitus without complications: Secondary | ICD-10-CM | POA: Diagnosis not present

## 2024-11-14 DIAGNOSIS — D72829 Elevated white blood cell count, unspecified: Secondary | ICD-10-CM | POA: Diagnosis not present

## 2024-11-14 LAB — COMPREHENSIVE METABOLIC PANEL WITH GFR
ALT: 20 U/L (ref 0–44)
AST: 23 U/L (ref 15–41)
Albumin: 3.5 g/dL (ref 3.5–5.0)
Alkaline Phosphatase: 57 U/L (ref 38–126)
Anion gap: 10 (ref 5–15)
BUN: 16 mg/dL (ref 8–23)
CO2: 23 mmol/L (ref 22–32)
Calcium: 8.6 mg/dL — ABNORMAL LOW (ref 8.9–10.3)
Chloride: 97 mmol/L — ABNORMAL LOW (ref 98–111)
Creatinine, Ser: 1.26 mg/dL — ABNORMAL HIGH (ref 0.61–1.24)
GFR, Estimated: >60 mL/min (ref >60)
Glucose, Bld: 178 mg/dL — ABNORMAL HIGH (ref 70–99)
Potassium: 3.9 mmol/L (ref 3.5–5.1)
Sodium: 130 mmol/L — ABNORMAL LOW (ref 135–145)
Total Bilirubin: 1 mg/dL (ref 0.0–1.2)
Total Protein: 6.7 g/dL (ref 6.5–8.1)

## 2024-11-14 LAB — POC COVID19/FLU A&B COMBO
Covid Antigen, POC: NEGATIVE
Influenza A Antigen, POC: NEGATIVE
Influenza B Antigen, POC: NEGATIVE

## 2024-11-14 LAB — CBC
HCT: 35.7 % — ABNORMAL LOW (ref 39.0–52.0)
Hemoglobin: 12.5 g/dL — ABNORMAL LOW (ref 13.0–17.0)
MCH: 32.7 pg (ref 26.0–34.0)
MCHC: 35 g/dL (ref 30.0–36.0)
MCV: 93.5 fL (ref 80.0–100.0)
Platelets: 380 K/uL (ref 150–400)
RBC: 3.82 MIL/uL — ABNORMAL LOW (ref 4.22–5.81)
RDW: 12.4 % (ref 11.5–15.5)
WBC: 16.9 K/uL — ABNORMAL HIGH (ref 4.0–10.5)
nRBC: 0 % (ref 0.0–0.2)

## 2024-11-14 MED ORDER — METHYLPREDNISOLONE SODIUM SUCC 125 MG IJ SOLR
125.0000 mg | Freq: Once | INTRAMUSCULAR | Status: AC
  Administered 2024-11-14: 125 mg via INTRAMUSCULAR

## 2024-11-14 MED ORDER — ALBUTEROL SULFATE (2.5 MG/3ML) 0.083% IN NEBU
2.5000 mg | INHALATION_SOLUTION | Freq: Once | RESPIRATORY_TRACT | Status: AC
  Administered 2024-11-14: 2.5 mg via RESPIRATORY_TRACT

## 2024-11-14 MED ORDER — MAGNESIUM SULFATE 2 GM/50ML IV SOLN
2.0000 g | Freq: Once | INTRAVENOUS | Status: AC
  Administered 2024-11-14: 2 g via INTRAVENOUS
  Filled 2024-11-14: qty 50

## 2024-11-14 MED ORDER — ACETAMINOPHEN 500 MG PO TABS
1000.0000 mg | ORAL_TABLET | ORAL | Status: AC
  Administered 2024-11-15: 1000 mg via ORAL
  Filled 2024-11-14: qty 2

## 2024-11-14 MED ORDER — ALBUTEROL SULFATE (2.5 MG/3ML) 0.083% IN NEBU
INHALATION_SOLUTION | RESPIRATORY_TRACT | Status: AC
  Filled 2024-11-14: qty 3

## 2024-11-14 MED ORDER — IPRATROPIUM-ALBUTEROL 0.5-2.5 (3) MG/3ML IN SOLN
3.0000 mL | Freq: Once | RESPIRATORY_TRACT | Status: AC
  Administered 2024-11-14: 3 mL via RESPIRATORY_TRACT

## 2024-11-14 MED ORDER — IPRATROPIUM-ALBUTEROL 0.5-2.5 (3) MG/3ML IN SOLN
RESPIRATORY_TRACT | Status: AC
  Filled 2024-11-14: qty 3

## 2024-11-14 MED ORDER — IPRATROPIUM-ALBUTEROL 0.5-2.5 (3) MG/3ML IN SOLN
3.0000 mL | Freq: Once | RESPIRATORY_TRACT | Status: AC
  Administered 2024-11-14: 3 mL via RESPIRATORY_TRACT
  Filled 2024-11-14: qty 3

## 2024-11-14 MED ORDER — METHYLPREDNISOLONE SODIUM SUCC 125 MG IJ SOLR
INTRAMUSCULAR | Status: AC
  Filled 2024-11-14: qty 2

## 2024-11-14 NOTE — ED Notes (Signed)
 Patient is being discharged from the Urgent Care and sent to the Emergency Department via [facility] . Per Connell Ryder, NPpatient is in need of higher level of care due to Shortness of breath, hypoxia. Patient is aware and verbalizes understanding of plan of care.  Vitals:   11/14/24 2019 11/14/24 2059  BP:  (!) 147/67  Pulse:  88  Resp:  18  Temp:  99.6 F (37.6 C)  SpO2: 92% 92%

## 2024-11-14 NOTE — ED Notes (Signed)
 Delon, RN charge at [facility] ED notified of patient coming to the ED for SOB and hypoxia

## 2024-11-14 NOTE — Discharge Instructions (Addendum)
???? ??? ???? ???? ?? ??? ??? ? ????. °  gugeubchaga dangsin-eul eung-geubsillo isonghayeo chuga geomsaleul baddolog hal geos-ibnida.  The ambulance is going to take you over to the emergency department for further evaluation.

## 2024-11-14 NOTE — ED Provider Notes (Signed)
 [facility]    CSN: [id number] Arrival date & time: 11/14/24  1839      History   Chief Complaint Chief Complaint  Patient presents with   Cough   Shortness of Breath    HPI Hunter Atkinson is a 73 y.o. male.   Patient presents with cough and shortness of breath that began yesterday.  Patient reports that his shortness of breath is worse with walking.  Patient denies any known fever, chest pain, body aches, chills, nausea, vomiting, diarrhea, and abdominal pain.  Patient denies any known sick exposures.  Patient denies any history of asthma or COPD.  The history is provided by the patient and medical records. The history is limited by a language barrier. A language interpreter was used (Korean interpreter).  Cough Associated symptoms: shortness of breath   Shortness of Breath Associated symptoms: cough     Past Medical History:  Diagnosis Date   Hypertension     Patient Active Problem List   Diagnosis Date Noted   Other spondylosis with radiculopathy, cervical region 06/27/2022    Past Surgical History:  Procedure Laterality Date   EYE SURGERY         Home Medications    Prior to Admission medications  Medication Sig Start Date End Date Taking? Authorizing Provider  losartan -hydrochlorothiazide  (HYZAAR) 100-25 MG tablet Take 1 tablet by mouth daily.    [provider]  predniSONE  (STERAPRED UNI-PAK 21 TAB) 5 MG (21) TBPK tablet Take 6,5,4,3,2,1 one tablet less each day. 12/17/23   Barbarann Oneil BROCKS, MD  tamsulosin  (FLOMAX ) 0.4 MG CAPS capsule Take 0.4 mg by mouth daily.    [provider]    Family History No family history on file.  Social History Social History[1]   Allergies   Patient has no known allergies.   Review of Systems Review of Systems  Respiratory:  Positive for cough and shortness of breath.    Per HPI  Physical Exam Triage Vital Signs ED Triage Vitals  Encounter Vitals Group     BP 11/14/24 1938 (!)  151/72     Girls Systolic BP Percentile --      Girls Diastolic BP Percentile --      Boys Systolic BP Percentile --      Boys Diastolic BP Percentile --      Pulse Rate 11/14/24 1938 95     Resp 11/14/24 1938 (!) 24     Temp 11/14/24 1938 99.8 F (37.7 C)     Temp Source 11/14/24 1938 Oral     SpO2 11/14/24 1938 (!) 89 %     Weight 11/14/24 1941 147 lb (66.7 kg)     Height 11/14/24 1941 5' 7 (1.702 m)     Head Circumference --      Peak Flow --      Pain Score 11/14/24 1941 0     Pain Loc --      Pain Education --      Exclude from Growth Chart --    No data found.  Updated Vital Signs BP (!) 151/72 (BP Location: Right Arm)   Pulse 95   Temp 99.8 F (37.7 C) (Oral)   Resp (!) 24   Ht 5' 7 (1.702 m)   Wt 147 lb (66.7 kg)   SpO2 92%   BMI 23.02 kg/m   Visual Acuity Right Eye Distance:   Left Eye Distance:   Bilateral Distance:    Right Eye Near:  Left Eye Near:    Bilateral Near:     Physical Exam Vitals and nursing note reviewed.  Constitutional:      General: He is awake. He is not in acute distress.    Appearance: Normal appearance. He is well-developed and well-groomed. He is not ill-appearing.  HENT:     Right Ear: Tympanic membrane, ear canal and external ear normal.     Left Ear: Tympanic membrane, ear canal and external ear normal.     Nose: Nose normal.     Mouth/Throat:     Mouth: Mucous membranes are moist.     Pharynx: Oropharynx is clear.  Cardiovascular:     Rate and Rhythm: Normal rate and regular rhythm.  Pulmonary:     Effort: Tachypnea present.     Breath sounds: Decreased air movement present. Decreased breath sounds and wheezing present.  Skin:    General: Skin is warm and dry.  Neurological:     Mental Status: He is alert.  Psychiatric:        Behavior: Behavior is cooperative.      UC Treatments / Results  Labs (all labs ordered are listed, but only abnormal results are displayed) Labs Reviewed  POC COVID19/FLU A&B  COMBO    EKG   Radiology DG Chest 2 View Result Date: 11/14/2024 EXAM: 2 VIEW(S) XRAY OF THE CHEST 11/14/2024 08:14:12 PM COMPARISON: None available. CLINICAL HISTORY: cough, shortness of breath began yesterday FINDINGS: LUNGS AND PLEURA: COPD is noted. No focal pulmonary opacity. No pleural effusion. No pneumothorax. HEART AND MEDIASTINUM: No acute abnormality of the cardiac and mediastinal silhouettes. BONES AND SOFT TISSUES: No acute osseous abnormality. IMPRESSION: 1. COPD without acute cardiopulmonary process identified. Electronically signed by: Oneil Devonshire MD 11/14/2024 08:20 PM EST RP Workstation: [id number]    Procedures Procedures (including critical care time)  Medications Ordered in UC Medications  ipratropium-albuterol  (DUONEB) 0.5-2.5 (3) MG/3ML nebulizer solution 3 mL (3 mLs Nebulization Given 11/14/24 1957)  methylPREDNISolone  sodium succinate (SOLU-MEDROL ) 125 mg/2 mL injection 125 mg (125 mg Intramuscular Given 11/14/24 1955)  albuterol  (PROVENTIL ) (2.5 MG/3ML) 0.083% nebulizer solution 2.5 mg (2.5 mg Nebulization Given 11/14/24 2018)    Initial Impression / Assessment and Plan / UC Course  I have reviewed the triage vital signs and the nursing notes.  Pertinent labs & imaging results that were available during my care of the patient were reviewed by me and considered in my medical decision making (see chart for details).     Patient initially presented appearing in some acute distress with tachypnea and hypoxia present.  SpO2 was initially 89%.  Decreased air movement, decreased breath sounds, and wheezing present on auscultation.  COVID and flu testing were negative.  Chest x-ray was ordered and revealed evidence of COPD which patient reports he has never been previously diagnosed with.  Given DuoNeb, albuterol  nebulizer, and Solu-Medrol  in clinic with minimal relief.  Oxygen had initially increased to 92% after DuoNeb and then dropped back down to 88% after  albuterol  treatment.  Lungs continue to have significant mount of wheezing and decreased air movement at this time.  Also had patient ambulate in the room briefly and he became very tachypneic and audible wheezing was noted.  Oxygen maintained to 88% while ambulating.  Patient placed on 2 L of oxygen initially with some improvement.  Recommended patient be seen in the emergency department due to new onset of hypoxia with minimally improved symptoms despite multiple nebulizer treatments and injection of Solu-Medrol  in clinic.  Patient and wife understanding of plan at this time.  [facility] called for transport to the emergency department due to new onset of shortness of breath with hypoxia requiring oxygen. Final Clinical Impressions(s) / UC Diagnoses   Final diagnoses:  Acute cough  COPD exacerbation (HCC)  Hypoxia     Discharge Instructions      ???? ??? ???? ???? ?? ??? ??? ? ????. gugeubchaga dangsin-eul eung-geubsillo isonghayeo chuga geomsaleul baddolog hal geos-ibnida.  The ambulance is going to take you over to the emergency department for further evaluation.     ED Prescriptions   None    PDMP not reviewed this encounter.    [1]  Social History Tobacco Use   Smoking status: Former    Types: Cigarettes    Passive exposure: Never   Smokeless tobacco: Never  Vaping Use   Vaping status: Never Used  Substance Use Topics   Alcohol use: Yes    Comment: 1-2 cans of beer   Drug use: Never     Johnie Rumaldo LABOR, NP 11/14/24 2045

## 2024-11-14 NOTE — ED Provider Notes (Incomplete)
°  [place] EMERGENCY DEPARTMENT AT [facility] Provider Note   CSN: [id number] Arrival date & time: 11/14/24  2138     Patient presents with: Shortness of Breath   Hunter Atkinson is a 73 y.o. male.  {Add pertinent medical, surgical, social history, OB history to HPI:32947}  Shortness of Breath  Patient is a 73 year old male with past medical history significant for COPD  He presents emergency room today with complaints of cough and shortness of breath that began yesterday he endorses some sinus congestion fatigue no hemoptysis he denies any chest pain no fever or bodyaches.       Prior to Admission medications  Medication Sig Start Date End Date Taking? Authorizing Provider  losartan -hydrochlorothiazide  (HYZAAR) 100-25 MG tablet Take 1 tablet by mouth daily.    [provider]  predniSONE  (STERAPRED UNI-PAK 21 TAB) 5 MG (21) TBPK tablet Take 6,5,4,3,2,1 one tablet less each day. 12/17/23   Barbarann Oneil BROCKS, MD  tamsulosin  (FLOMAX ) 0.4 MG CAPS capsule Take 0.4 mg by mouth daily.    [provider]    Allergies: Patient has no known allergies.    Review of Systems  Respiratory:  Positive for shortness of breath.     Updated Vital Signs Ht 5' 7 (1.702 m)   Wt 66.7 kg   BMI 23.03 kg/m   Physical Exam  (all labs ordered are listed, but only abnormal results are displayed) Labs Reviewed  RESP PANEL BY RT-PCR (RSV, FLU A&B, COVID)  RVPGX2  CBC  COMPREHENSIVE METABOLIC PANEL WITH GFR    EKG: None  Radiology: DG Chest 2 View Result Date: 11/14/2024 EXAM: 2 VIEW(S) XRAY OF THE CHEST 11/14/2024 08:14:12 PM COMPARISON: None available. CLINICAL HISTORY: cough, shortness of breath began yesterday FINDINGS: LUNGS AND PLEURA: COPD is noted. No focal pulmonary opacity. No pleural effusion. No pneumothorax. HEART AND MEDIASTINUM: No acute abnormality of the cardiac and mediastinal silhouettes. BONES AND SOFT TISSUES: No acute osseous abnormality.  IMPRESSION: 1. COPD without acute cardiopulmonary process identified. Electronically signed by: Oneil Devonshire MD 11/14/2024 08:20 PM EST RP Workstation: [id number]    {Document cardiac monitor, telemetry assessment procedure when appropriate:32947} Procedures   Medications Ordered in the ED  magnesium  sulfate IVPB 2 g 50 mL (has no administration in time range)  ipratropium-albuterol  (DUONEB) 0.5-2.5 (3) MG/3ML nebulizer solution 3 mL (has no administration in time range)  ipratropium-albuterol  (DUONEB) 0.5-2.5 (3) MG/3ML nebulizer solution 3 mL (has no administration in time range)      {Click here for ABCD2, HEART and other calculators REFRESH Note before signing:1}                              Medical Decision Making Amount and/or Complexity of Data Reviewed Labs: ordered.  Risk Prescription drug management.   ***  {Document critical care time when appropriate  Document review of labs and clinical decision tools ie CHADS2VASC2, etc  Document your independent review of radiology images and any outside records  Document your discussion with family members, caretakers and with consultants  Document social determinants of health affecting pt's care  Document your decision making why or why not admission, treatments were needed:32947:::1}   Final diagnoses:  None    ED Discharge Orders     None

## 2024-11-14 NOTE — ED Notes (Signed)
 Patient transported to X-ray

## 2024-11-14 NOTE — ED Triage Notes (Signed)
 PT BIB [facility] with a c/o SOB from [facility]. SOB started yesterday and was described as being worst on exertion.Xray at Urgent care showed COPD with PT having no prior history. PT giving albuterol , duoneb and solumedrol before arrival. PT placed on 2L for comfort. PT does speak Korean but son is bedside as interpreter.

## 2024-11-14 NOTE — ED Notes (Signed)
 Pt's sat's after 2'nd neb tx 88% on R/A     Pt placed on 02 via [place] at 2LPM.

## 2024-11-14 NOTE — ED Notes (Signed)
 Pt's 02 sat 92% after first neb tx.

## 2024-11-15 DIAGNOSIS — J441 Chronic obstructive pulmonary disease with (acute) exacerbation: Principal | ICD-10-CM | POA: Diagnosis present

## 2024-11-15 LAB — CBC
HCT: 35.4 % — ABNORMAL LOW (ref 39.0–52.0)
Hemoglobin: 12.3 g/dL — ABNORMAL LOW (ref 13.0–17.0)
MCH: 32.7 pg (ref 26.0–34.0)
MCHC: 34.7 g/dL (ref 30.0–36.0)
MCV: 94.1 fL (ref 80.0–100.0)
Platelets: 373 K/uL (ref 150–400)
RBC: 3.76 MIL/uL — ABNORMAL LOW (ref 4.22–5.81)
RDW: 12.4 % (ref 11.5–15.5)
WBC: 14.9 K/uL — ABNORMAL HIGH (ref 4.0–10.5)
nRBC: 0 % (ref 0.0–0.2)

## 2024-11-15 LAB — BASIC METABOLIC PANEL WITH GFR
Anion gap: 16 — ABNORMAL HIGH (ref 5–15)
BUN: 19 mg/dL (ref 8–23)
CO2: 18 mmol/L — ABNORMAL LOW (ref 22–32)
Calcium: 8.5 mg/dL — ABNORMAL LOW (ref 8.9–10.3)
Chloride: 96 mmol/L — ABNORMAL LOW (ref 98–111)
Creatinine, Ser: 1.54 mg/dL — ABNORMAL HIGH (ref 0.61–1.24)
GFR, Estimated: 48 mL/min — ABNORMAL LOW (ref >60)
Glucose, Bld: 240 mg/dL — ABNORMAL HIGH (ref 70–99)
Potassium: 3.8 mmol/L (ref 3.5–5.1)
Sodium: 130 mmol/L — ABNORMAL LOW (ref 135–145)

## 2024-11-15 LAB — RESP PANEL BY RT-PCR (RSV, FLU A&B, COVID)  RVPGX2
Influenza A by PCR: NEGATIVE
Influenza B by PCR: NEGATIVE
Resp Syncytial Virus by PCR: NEGATIVE
SARS Coronavirus 2 by RT PCR: NEGATIVE

## 2024-11-15 LAB — CREATININE, SERUM
Creatinine, Ser: 1.56 mg/dL — ABNORMAL HIGH (ref 0.61–1.24)
GFR, Estimated: 47 mL/min — ABNORMAL LOW (ref >60)

## 2024-11-15 LAB — MAGNESIUM: Magnesium: 2.6 mg/dL — ABNORMAL HIGH (ref 1.7–2.4)

## 2024-11-15 LAB — HIV ANTIBODY (ROUTINE TESTING W REFLEX): HIV Screen 4th Generation wRfx: NONREACTIVE

## 2024-11-15 MED ORDER — PREDNISONE 20 MG PO TABS: 40.0000 mg | ORAL_TABLET | Freq: Every day | ORAL | 0 refills | Status: AC

## 2024-11-15 MED ORDER — DOXYCYCLINE HYCLATE 100 MG PO TABS: 100.0000 mg | ORAL_TABLET | Freq: Two times a day (BID) | ORAL | 0 refills | Status: AC

## 2024-11-15 MED ORDER — LOSARTAN POTASSIUM 50 MG PO TABS
100.0000 mg | ORAL_TABLET | Freq: Every day | ORAL | Status: DC
  Administered 2024-11-15: 100 mg via ORAL
  Filled 2024-11-15: qty 2

## 2024-11-15 MED ORDER — METHYLPREDNISOLONE SODIUM SUCC 125 MG IJ SOLR
125.0000 mg | Freq: Once | INTRAMUSCULAR | Status: AC
  Administered 2024-11-15: 125 mg via INTRAVENOUS
  Filled 2024-11-15: qty 2

## 2024-11-15 MED ORDER — IPRATROPIUM-ALBUTEROL 0.5-2.5 (3) MG/3ML IN SOLN
3.0000 mL | Freq: Four times a day (QID) | RESPIRATORY_TRACT | Status: DC
  Administered 2024-11-15 (×2): 3 mL via RESPIRATORY_TRACT
  Filled 2024-11-15 (×2): qty 3

## 2024-11-15 MED ORDER — AZITHROMYCIN 250 MG PO TABS: 500.0000 mg | ORAL_TABLET | Freq: Every day | ORAL | Status: DC

## 2024-11-15 MED ORDER — BUDESONIDE 0.5 MG/2ML IN SUSP
2.0000 mg | Freq: Two times a day (BID) | RESPIRATORY_TRACT | Status: DC
  Administered 2024-11-15: 2 mg via RESPIRATORY_TRACT
  Filled 2024-11-15: qty 8

## 2024-11-15 MED ORDER — HYDROCHLOROTHIAZIDE 25 MG PO TABS
25.0000 mg | ORAL_TABLET | Freq: Every day | ORAL | Status: DC
  Administered 2024-11-15: 25 mg via ORAL
  Filled 2024-11-15: qty 1

## 2024-11-15 MED ORDER — ALBUTEROL SULFATE (2.5 MG/3ML) 0.083% IN NEBU: 2.5000 mg | INHALATION_SOLUTION | RESPIRATORY_TRACT | Status: DC | PRN

## 2024-11-15 MED ORDER — ENOXAPARIN SODIUM 40 MG/0.4ML IJ SOSY
40.0000 mg | PREFILLED_SYRINGE | INTRAMUSCULAR | Status: DC
  Administered 2024-11-15: 40 mg via SUBCUTANEOUS
  Filled 2024-11-15: qty 0.4

## 2024-11-15 MED ORDER — PREDNISONE 20 MG PO TABS: 40.0000 mg | ORAL_TABLET | Freq: Every day | ORAL | Status: DC

## 2024-11-15 MED ORDER — TAMSULOSIN HCL 0.4 MG PO CAPS
0.4000 mg | ORAL_CAPSULE | Freq: Every day | ORAL | Status: DC
  Administered 2024-11-15: 0.4 mg via ORAL
  Filled 2024-11-15: qty 1

## 2024-11-15 MED ORDER — AMOXICILLIN-POT CLAVULANATE 875-125 MG PO TABS: 1.0000 | ORAL_TABLET | Freq: Two times a day (BID) | ORAL | 0 refills | Status: AC

## 2024-11-15 MED ORDER — SODIUM CHLORIDE 0.9 % IV SOLN
500.0000 mg | INTRAVENOUS | Status: AC
  Administered 2024-11-15: 500 mg via INTRAVENOUS
  Filled 2024-11-15: qty 5

## 2024-11-15 MED ORDER — ALBUTEROL SULFATE HFA 108 (90 BASE) MCG/ACT IN AERS: 2.0000 | INHALATION_SPRAY | Freq: Four times a day (QID) | RESPIRATORY_TRACT | 1 refills | Status: AC | PRN

## 2024-11-15 MED ORDER — LOSARTAN POTASSIUM-HCTZ 100-25 MG PO TABS: 1.0000 | ORAL_TABLET | Freq: Every day | ORAL | Status: DC

## 2024-11-15 NOTE — Care Management Obs Status (Signed)
 MEDICARE OBSERVATION STATUS NOTIFICATION   Patient Details  Name: Hunter Atkinson MRN: [id number] Date of Birth: December 08, 1950   Medicare Observation Status Notification Given:  Yes  Spoke to son via interpretation services  Corean JAYSON Canary, RN 11/15/2024, 11:46 AM

## 2024-11-15 NOTE — ED Notes (Signed)
 Pt walked in the hallway and maintained an O2 of 94-95.  He walked about 50 feet.

## 2024-11-15 NOTE — Care Management (Signed)
°  Transition of Care [facility]) Screening Note   Patient Details  Name: Nilesh Stegall Date of Birth: 07-Jan-1951   Transition of Care [facility]) CM/SW Contact:    Corean JAYSON Canary, RN Phone Number: 11/15/2024, 11:48 AM    Transition of Care Department [facility]) has reviewed patient and no TOC needs have been identified at this time. We will continue to monitor patient advancement through interdisciplinary progression rounds. If new patient transition needs arise, please place a TOC consult.

## 2024-11-15 NOTE — H&P (Signed)
 History and Physical    Hunter Atkinson FMW:[id number] DOB: [birth year] DOA: 11/14/2024  PCP: Patient, No Pcp Per  Patient coming from: Home  I have personally briefly reviewed patient's old medical records in [facility]  Chief Complaint: Shortness of breath  HPI: Hunter Atkinson is a 73 y.o. male with medical history significant of hypertension, prediabetes, elevated PSA, tobacco abuse presented here with worsening shortness of breath.  Patient reports that he has productive cough with yellow sputum since 3 to 4 days associated with some shortness of breath that he started getting worse yesterday.  He initially went to [facility] urgent care where chest x-ray was done that showed COPD.  He was given breathing treatment and placed on oxygen and brought by EMS to ED for further evaluation and management.   Patient is Korean.  History gathered from patient's son at the bedside.  Patient reports some headache but denies fever, chills, sore throat, body ache, recent sick contact.  He has history of smoking since 15 years.  Currently smokes 10 cigarettes/day.  No history of alcohol abuse or illicit drug use.  He does not use oxygen at home.  ED Course: Upon arrival to ED: Temperature 99.6, pulse 77, RR: 19, BP 131/55, on 2 L of oxygen via nasal cannula.  COVID flu RSV negative.  CBC shows leukocytosis of 16.9, H&H 12.5/35.7, PLT: 380.  NA: 130, CL: 97, creatinine 1.26, normal liver enzyme.  Chest x-ray shows hyperinflated lungs compatible with air trapping/COPD.  No pneumonia.  Patient was given DuoNebs and magnesium  sulfate in ER.  Prior hospitalist consulted for admission.  Review of Systems: As per HPI otherwise negative.    Past Medical History:  Diagnosis Date   Hypertension     Past Surgical History:  Procedure Laterality Date   EYE SURGERY       reports that he has quit smoking. His smoking use included cigarettes. He has never been exposed to tobacco smoke. He has never used  smokeless tobacco. He reports current alcohol use. He reports that he does not use drugs.  Allergies[1]  History reviewed. No pertinent family history.  Prior to Admission medications  Medication Sig Start Date End Date Taking? Authorizing Provider  losartan -hydrochlorothiazide  (HYZAAR) 100-25 MG tablet Take 1 tablet by mouth daily.    [provider]  predniSONE  (STERAPRED UNI-PAK 21 TAB) 5 MG (21) TBPK tablet Take 6,5,4,3,2,1 one tablet less each day. 12/17/23   Barbarann Oneil BROCKS, MD  tamsulosin  (FLOMAX ) 0.4 MG CAPS capsule Take 0.4 mg by mouth daily.    [provider]    Physical Exam: Vitals:   11/14/24 2203 11/14/24 2245 11/14/24 2300 11/14/24 2315  BP:  (!) 131/55 (!) 142/66 (!) 133/57  Pulse:  77 77 73  Resp:  19 17 18   Temp:  99.6 F (37.6 C)    TempSrc:  Oral    SpO2:  99% 100% 100%  Weight: 66.7 kg     Height: 5' 7 (1.702 m)       Constitutional: NAD, calm, comfortable, on room air, family at the bedside Eyes: PERRL, lids and conjunctivae normal ENMT: Mucous membranes are moist. Posterior pharynx clear of any exudate or lesions.Normal dentition.  Neck: normal, supple, no masses, no thyromegaly Respiratory:  Expiratory wheezing noted bilaterally Cardiovascular: Regular rate and rhythm, no murmurs / rubs / gallops. No extremity edema. 2+ pedal pulses. No carotid bruits.  Abdomen: no tenderness, no masses palpated. No hepatosplenomegaly. Bowel sounds positive.  Musculoskeletal: no clubbing / cyanosis. No joint deformity upper and lower extremities. Good ROM, no contractures. Normal muscle tone.  Skin: no rashes, lesions, ulcers. No induration Neurologic: CN 2-12 grossly intact. Sensation intact, DTR normal. Strength 5/5 in all 4.  Psychiatric: Normal judgment and insight. Alert and oriented x 3. Normal mood.    Labs on Admission: I have personally reviewed following labs and imaging studies  CBC: Recent Labs  Lab 11/14/24 2245  WBC 16.9*  HGB  12.5*  HCT 35.7*  MCV 93.5  PLT 380   Basic Metabolic Panel: Recent Labs  Lab 11/14/24 2245  NA 130*  K 3.9  CL 97*  CO2 23  GLUCOSE 178*  BUN 16  CREATININE 1.26*  CALCIUM 8.6*   GFR: Estimated Creatinine Clearance: 50.3 mL/min (A) (by C-G formula based on SCr of 1.26 mg/dL (H)). Liver Function Tests: Recent Labs  Lab 11/14/24 2245  AST 23  ALT 20  ALKPHOS 57  BILITOT 1.0  PROT 6.7  ALBUMIN 3.5   No results for input(s): LIPASE, AMYLASE in the last 168 hours. No results for input(s): AMMONIA in the last 168 hours. Coagulation Profile: No results for input(s): INR, PROTIME in the last 168 hours. Cardiac Enzymes: No results for input(s): CKTOTAL, CKMB, CKMBINDEX, TROPONINI in the last 168 hours. BNP (last 3 results) No results for input(s): PROBNP in the last 8760 hours. HbA1C: No results for input(s): HGBA1C in the last 72 hours. CBG: No results for input(s): GLUCAP in the last 168 hours. Lipid Profile: No results for input(s): CHOL, HDL, LDLCALC, TRIG, CHOLHDL, LDLDIRECT in the last 72 hours. Thyroid Function Tests: No results for input(s): TSH, T4TOTAL, FREET4, T3FREE, THYROIDAB in the last 72 hours. Anemia Panel: No results for input(s): VITAMINB12, FOLATE, FERRITIN, TIBC, IRON, RETICCTPCT in the last 72 hours. Urine analysis: No results found for: COLORURINE, APPEARANCEUR, LABSPEC, PHURINE, GLUCOSEU, HGBUR, BILIRUBINUR, KETONESUR, PROTEINUR, UROBILINOGEN, NITRITE, LEUKOCYTESUR  Radiological Exams on Admission: DG Chest 2 View Result Date: 11/14/2024 EXAM: 2 VIEW(S) XRAY OF THE CHEST 11/14/2024 11:03:00 PM COMPARISON: 11/14/2024 CLINICAL HISTORY: SOB FINDINGS: LUNGS AND PLEURA: Hyperinflated lungs. No focal pulmonary opacity. No pleural effusion. No pneumothorax. HEART AND MEDIASTINUM: No acute abnormality of the cardiac and mediastinal silhouettes. BONES AND SOFT TISSUES: No  acute osseous abnormality. IMPRESSION: 1. Hyperinflated lungs, compatible with air-trapping/COPD. Electronically signed by: Oneil Devonshire MD 11/14/2024 11:08 PM EST RP Workstation: [id number]   DG Chest 2 View Result Date: 11/14/2024 EXAM: 2 VIEW(S) XRAY OF THE CHEST 11/14/2024 08:14:12 PM COMPARISON: None available. CLINICAL HISTORY: cough, shortness of breath began yesterday FINDINGS: LUNGS AND PLEURA: COPD is noted. No focal pulmonary opacity. No pleural effusion. No pneumothorax. HEART AND MEDIASTINUM: No acute abnormality of the cardiac and mediastinal silhouettes. BONES AND SOFT TISSUES: No acute osseous abnormality. IMPRESSION: 1. COPD without acute cardiopulmonary process identified. Electronically signed by: Oneil Devonshire MD 11/14/2024 08:20 PM EST RP Workstation: [id number]    EKG: Independently reviewed.  Sinus rhythm.  Right axis deviation.  No acute ST-T wave changes noted.  Assessment/Plan  Acute hypoxemic respiratory failure in the setting of COPD exacerbation: - Patient has been smoking for over 50 years - Presented with cough and worsening shortness of breath.  Wheezing noted on exam. - COVID flu RSV negative.  Reviewed chest x-ray. - Admit patient on the floor.  On continuous pulse ox. - Check sputum culture and respiratory pathogen panel - Start DuoNebs scheduled and as needed.  Start steroids and Z-Pak - Try to wean  off of oxygen as tolerated.  Leukocytosis: - Patient is  afebrile.  Chest x-ray negative for pneumonia. - Continue to monitor.  Hypertension: Resume home medications losartan -hydrochlorothiazide  - Monitor BP closely  History of elevated PSA BPH: Continue Flomax   Hyponatremia: - Likely due to dehydration.  Continue to monitor.  Diet controlled type 2 diabetes: - A1c noted to be 6.8 checked on 08/19/2024. - Currently not on any treatment.  Follow-up with PCP  Hyperlipidemia: Currently not on any treatment  DVT prophylaxis: Lovenox /SCD Code Status: Full  code Family Communication: Patient's son and wife present at bedside.  Plan of care discussed with patient in length and he verbalized understanding and agreed with it. Disposition Plan: Home Consults called: None Admission status: Inpatient   Velna JONELLE Skeeter MD [facility]  If 7PM-7AM, please contact night-coverage [email or web address]  11/15/2024, 12:04 AM       [1] No Known Allergies

## 2024-11-15 NOTE — ED Notes (Signed)
 Report given to receiving RN.

## 2024-11-15 NOTE — Hospital Course (Signed)
 73 y.o. male with medical history significant of hypertension, prediabetes, elevated PSA, tobacco abuse presented here with worsening shortness of breath.   Patient reports that he has productive cough with yellow sputum since 3 to 4 days associated with some shortness of breath that he started getting worse yesterday.  He initially went to [facility] urgent care where chest x-ray was done that showed COPD.  He was given breathing treatment and placed on oxygen and brought by EMS to ED for further evaluation and management.     Patient is Korean.  History gathered from patient's son at the bedside.  Patient reports some headache but denies fever, chills, sore throat, body ache, recent sick contact.  He has history of smoking since 15 years.  Currently smokes 10 cigarettes/day.  No history of alcohol abuse or illicit drug use.  He does not use oxygen at home.

## 2024-11-15 NOTE — ED Notes (Signed)
 Spoke with pt and pt son about transferring to [facility] for admission and both were in agreement to transfer.

## 2024-11-15 NOTE — Care Management CC44 (Signed)
 Condition Code 44 Documentation Completed  Patient Details  Name: Sadiq Mccauley MRN: [id number] Date of Birth: [birth year]   Condition Code 44 given:  Yes Patient signature on Condition Code 44 notice:  Yes Documentation of 2 MD's agreement:  Yes Code 44 added to claim:  Yes    Corean JAYSON Canary, RN 11/15/2024, 11:46 AM

## 2024-11-15 NOTE — Progress Notes (Signed)
° ° °  EXPEDITER LEVEL LOADING ASSESSMENT NOTE  Patient Name: Hunter Atkinson  DOB:1951-11-04 Date of Admission: 11/14/2024  Date of Assessment:11/15/2024   -------------------------------------------------------------------------------------------------------------------   Brief clinical summary: 73 y.o. male with medical history significant of hypertension, prediabetes, elevated PSA, tobacco abuse presented here with worsening shortness of breath.   Is there Bed Availability at another [facility]? Yes  If yes, what facility: [facility]  Level of Care Needed:  Yes  MD Agree to transfer: N/A. Pt to be discharged home.  Patient agrees to transfer: Yes    -------------------------------------------------------------------------------------------------------------------  [facility] RN Expediter, Tedford Berg S Laksh Hinners Please contact us  directly via secure chat (search for [facility]) or by calling us  at [phone] [facility]).

## 2024-11-15 NOTE — Discharge Summary (Signed)
 Physician Discharge Summary   Patient: Hunter Atkinson MRN: [id number] DOB: 11/07/1951  Admit date:     11/14/2024  Discharge date: 11/15/2024  Discharge Physician: Garnette Pelt   PCP: Patient, No Pcp Per   Recommendations at discharge:    Follow up with PCP in 1-2 weeks  Discharge Diagnoses: Principal Problem:   COPD exacerbation (HCC)  Resolved Problems:   * No resolved hospital problems. *  Hospital Course: 73 y.o. male with medical history significant of hypertension, prediabetes, elevated PSA, tobacco abuse presented here with worsening shortness of breath.   Patient reports that he has productive cough with yellow sputum since 3 to 4 days associated with some shortness of breath that he started getting worse yesterday.  He initially went to [facility] urgent care where chest x-ray was done that showed COPD.  He was given breathing treatment and placed on oxygen and brought by EMS to ED for further evaluation and management.     Patient is [place].  History gathered from patient's son at the bedside.  Patient reports some headache but denies fever, chills, sore throat, body ache, recent sick contact.  He has history of smoking since 17 years.  Currently smokes 10 cigarettes/day.  No history of alcohol abuse or illicit drug use.  He does not use oxygen at home.  Assessment and Plan: Acute hypoxemic respiratory failure in the setting of COPD exacerbation: - Patient has been smoking for over 50 years - Presented with cough and worsening shortness of breath.  Wheezing noted on exam. - COVID flu RSV negative.   - clinically improved with nebs and steroids - Pt was able to ambulate without requiring supplemental O2 at time of d/c -Prescribed doxy with augmentin  on d/c empirically    Leukocytosis: - Patient is  afebrile.  Chest x-ray negative for pneumonia. - Continue to monitor.   Hypertension: Resume home medications losartan -hydrochlorothiazide  - Monitor BP closely   History  of elevated PSA BPH: Continue Flomax    Hyponatremia: - Likely due to dehydration.  Continue to monitor.   Diet controlled type 2 diabetes: - A1c noted to be 6.8 checked on 08/19/2024. - Currently not on any treatment.  Follow-up with PCP   Hyperlipidemia: Currently not on any treatment    Consultants:  Procedures performed:   Disposition: Home Diet recommendation:  Regular diet DISCHARGE MEDICATION: Allergies as of 11/15/2024   No Known Allergies      Medication List     TAKE these medications    albuterol  108 (90 Base) MCG/ACT inhaler Commonly known as: VENTOLIN  HFA Inhale 2 puffs into the lungs every 6 (six) hours as needed for wheezing or shortness of breath.   amoxicillin -clavulanate 875-125 MG tablet Commonly known as: AUGMENTIN  Take 1 tablet by mouth 2 (two) times daily for 5 days.   doxycycline  100 MG tablet Commonly known as: VIBRA -TABS Take 1 tablet (100 mg total) by mouth 2 (two) times daily.   ferrous sulfate 325 (65 FE) MG tablet Take 325 mg by mouth daily.   losartan -hydrochlorothiazide  100-25 MG tablet Commonly known as: HYZAAR Take 1 tablet by mouth daily.   metFORMIN 500 MG tablet Commonly known as: GLUCOPHAGE Take 500 mg by mouth every other day.   predniSONE  20 MG tablet Commonly known as: DELTASONE  Take 2 tablets (40 mg total) by mouth daily with breakfast for 5 days. Start taking on: November 16, 2024   rosuvastatin 10 MG tablet Commonly known as: CRESTOR Take 10 mg by mouth every other day.  tamsulosin  0.4 MG Caps capsule Commonly known as: FLOMAX  Take 0.4 mg by mouth daily.        Follow-up Information     Follow up with your PCP in 1-2 weeks Follow up.   Why: Hospital follow up               Discharge Exam: Filed Weights   11/14/24 2203  Weight: 66.7 kg   General exam: Awake, laying in bed, in nad Respiratory system: Normal respiratory effort, wheezing Cardiovascular system: regular rate, s1,  s2 Gastrointestinal system: Soft, nondistended, positive BS Central nervous system: CN2-12 grossly intact, strength intact Extremities: Perfused, no clubbing Skin: Normal skin turgor, no notable skin lesions seen Psychiatry: Mood normal // no visual hallucinations   Condition at discharge: fair  The results of significant diagnostics from this hospitalization (including imaging, microbiology, ancillary and laboratory) are listed below for reference.   Imaging Studies: DG Chest 2 View Result Date: 11/14/2024 EXAM: 2 VIEW(S) XRAY OF THE CHEST 11/14/2024 11:03:00 PM COMPARISON: 11/14/2024 CLINICAL HISTORY: SOB FINDINGS: LUNGS AND PLEURA: Hyperinflated lungs. No focal pulmonary opacity. No pleural effusion. No pneumothorax. HEART AND MEDIASTINUM: No acute abnormality of the cardiac and mediastinal silhouettes. BONES AND SOFT TISSUES: No acute osseous abnormality. IMPRESSION: 1. Hyperinflated lungs, compatible with air-trapping/COPD. Electronically signed by: Oneil Devonshire MD 11/14/2024 11:08 PM EST RP Workstation: [id number]   DG Chest 2 View Result Date: 11/14/2024 EXAM: 2 VIEW(S) XRAY OF THE CHEST 11/14/2024 08:14:12 PM COMPARISON: None available. CLINICAL HISTORY: cough, shortness of breath began yesterday FINDINGS: LUNGS AND PLEURA: COPD is noted. No focal pulmonary opacity. No pleural effusion. No pneumothorax. HEART AND MEDIASTINUM: No acute abnormality of the cardiac and mediastinal silhouettes. BONES AND SOFT TISSUES: No acute osseous abnormality. IMPRESSION: 1. COPD without acute cardiopulmonary process identified. Electronically signed by: Oneil Devonshire MD 11/14/2024 08:20 PM EST RP Workstation: [id number]    Microbiology: Results for orders placed or performed during the hospital encounter of 11/14/24  Resp panel by RT-PCR (RSV, Flu A&B, Covid) Anterior Nasal Swab     Status: None   Collection Time: 11/14/24  9:58 PM   Specimen: Anterior Nasal Swab  Result Value Ref Range Status   SARS  Coronavirus 2 by RT PCR NEGATIVE NEGATIVE Final   Influenza A by PCR NEGATIVE NEGATIVE Final   Influenza B by PCR NEGATIVE NEGATIVE Final    Comment: (NOTE) The Xpert Xpress SARS-CoV-2/FLU/RSV plus assay is intended as an aid in the diagnosis of influenza from Nasopharyngeal swab specimens and should not be used as a sole basis for treatment. Nasal washings and aspirates are unacceptable for Xpert Xpress SARS-CoV-2/FLU/RSV testing.  Fact Sheet for Patients: bloggercourse.com  Fact Sheet for Healthcare Providers: seriousbroker.it  This test is not yet approved or cleared by the [place]  FDA and has been authorized for detection and/or diagnosis of SARS-CoV-2 by FDA under an Emergency Use Authorization (EUA). This EUA will remain in effect (meaning this test can be used) for the duration of the COVID-19 declaration under Section 564(b)(1) of the Act, 21 U.S.C. section 360bbb-3(b)(1), unless the authorization is terminated or revoked.     Resp Syncytial Virus by PCR NEGATIVE NEGATIVE Final    Comment: (NOTE) Fact Sheet for Patients: bloggercourse.com  Fact Sheet for Healthcare Providers: seriousbroker.it  This test is not yet approved or cleared by the [place]  FDA and has been authorized for detection and/or diagnosis of SARS-CoV-2 by FDA under an Emergency Use Authorization (EUA). This EUA will remain in effect (  meaning this test can be used) for the duration of the COVID-19 declaration under Section 564(b)(1) of the Act, 21 U.S.C. section 360bbb-3(b)(1), unless the authorization is terminated or revoked.  Performed at [facility], [address], [place], [place] [address]     Labs: CBC: Recent Labs  Lab 11/14/24 2245 11/15/24 0548  WBC 16.9* 14.9*  HGB 12.5* 12.3*  HCT 35.7* 35.4*  MCV 93.5 94.1  PLT 380 373   Basic Metabolic Panel: Recent Labs   Lab 11/14/24 2245 11/15/24 0548  NA 130* 130*  K 3.9 3.8  CL 97* 96*  CO2 23 18*  GLUCOSE 178* 240*  BUN 16 19  CREATININE 1.26* 1.54*  1.56*  CALCIUM 8.6* 8.5*  MG  --  2.6*   Liver Function Tests: Recent Labs  Lab 11/14/24 2245  AST 23  ALT 20  ALKPHOS 57  BILITOT 1.0  PROT 6.7  ALBUMIN 3.5   CBG: No results for input(s): GLUCAP in the last 168 hours.  Discharge time spent: less than 30 minutes.  Signed: Garnette Pelt, MD [facility] 11/15/2024

## 2024-11-17 LAB — RESPIRATORY PANEL BY PCR

## 2025-01-07 ENCOUNTER — Telehealth: Payer: Self-pay | Admitting: Orthopaedic Surgery

## 2025-01-07 NOTE — Telephone Encounter (Signed)
 Received call from patient and his son. Needs records to be faxed to [facility], Attn Duwaine Roys. I accepted verbal authorization to fax medical records 05/25/2022 to 01/21/2024. These notes have been faxed [phone], Attn Duwaine Roys (fax number provided by patient)
